# Patient Record
Sex: Female | Born: 1960 | Race: Black or African American | Hispanic: No | Marital: Married | Smoking: Never smoker
Health system: Southern US, Community
[De-identification: ages and names within clinical notes are randomized; demographics above are authoritative.]

## PROBLEM LIST (undated history)

## (undated) HISTORY — PX: ABDOMINAL HYSTERECTOMY: SHX81

---

## 1999-08-14 ENCOUNTER — Other Ambulatory Visit: Admission: RE | Admit: 1999-08-14 | Discharge: 1999-08-14 | Payer: Self-pay | Admitting: Family Medicine

## 1999-08-23 ENCOUNTER — Ambulatory Visit (HOSPITAL_COMMUNITY): Admission: RE | Admit: 1999-08-23 | Discharge: 1999-08-23 | Payer: Self-pay | Admitting: Family Medicine

## 1999-08-23 ENCOUNTER — Encounter: Payer: Self-pay | Admitting: Family Medicine

## 2000-07-10 ENCOUNTER — Emergency Department (HOSPITAL_COMMUNITY): Admission: EM | Admit: 2000-07-10 | Discharge: 2000-07-10 | Payer: Self-pay

## 2000-09-24 ENCOUNTER — Ambulatory Visit (HOSPITAL_COMMUNITY): Admission: RE | Admit: 2000-09-24 | Discharge: 2000-09-24 | Payer: Self-pay

## 2003-02-01 ENCOUNTER — Encounter: Payer: Self-pay | Admitting: Plastic Surgery

## 2003-02-01 ENCOUNTER — Ambulatory Visit (HOSPITAL_COMMUNITY): Admission: RE | Admit: 2003-02-01 | Discharge: 2003-02-01 | Payer: Self-pay | Admitting: Plastic Surgery

## 2003-03-12 ENCOUNTER — Emergency Department (HOSPITAL_COMMUNITY): Admission: EM | Admit: 2003-03-12 | Discharge: 2003-03-12 | Payer: Self-pay | Admitting: Emergency Medicine

## 2003-03-13 ENCOUNTER — Encounter: Payer: Self-pay | Admitting: *Deleted

## 2003-03-15 ENCOUNTER — Other Ambulatory Visit: Admission: RE | Admit: 2003-03-15 | Discharge: 2003-03-15 | Payer: Self-pay | Admitting: *Deleted

## 2003-04-11 ENCOUNTER — Encounter (INDEPENDENT_AMBULATORY_CARE_PROVIDER_SITE_OTHER): Payer: Self-pay

## 2003-04-11 ENCOUNTER — Inpatient Hospital Stay (HOSPITAL_COMMUNITY): Admission: RE | Admit: 2003-04-11 | Discharge: 2003-04-15 | Payer: Self-pay | Admitting: *Deleted

## 2003-04-17 ENCOUNTER — Encounter: Payer: Self-pay | Admitting: Obstetrics and Gynecology

## 2003-04-17 ENCOUNTER — Inpatient Hospital Stay (HOSPITAL_COMMUNITY): Admission: AD | Admit: 2003-04-17 | Discharge: 2003-04-22 | Payer: Self-pay | Admitting: Obstetrics and Gynecology

## 2003-04-18 ENCOUNTER — Encounter: Payer: Self-pay | Admitting: Obstetrics and Gynecology

## 2003-04-21 ENCOUNTER — Encounter: Payer: Self-pay | Admitting: Obstetrics and Gynecology

## 2004-05-15 ENCOUNTER — Other Ambulatory Visit: Admission: RE | Admit: 2004-05-15 | Discharge: 2004-05-15 | Payer: Self-pay | Admitting: Obstetrics and Gynecology

## 2005-06-13 ENCOUNTER — Other Ambulatory Visit: Admission: RE | Admit: 2005-06-13 | Discharge: 2005-06-13 | Payer: Self-pay | Admitting: Obstetrics and Gynecology

## 2007-09-08 ENCOUNTER — Encounter: Admission: RE | Admit: 2007-09-08 | Discharge: 2007-09-08 | Payer: Self-pay | Admitting: Obstetrics & Gynecology

## 2010-12-09 ENCOUNTER — Encounter: Payer: Self-pay | Admitting: Obstetrics & Gynecology

## 2011-03-14 ENCOUNTER — Other Ambulatory Visit: Payer: Self-pay | Admitting: Emergency Medicine

## 2011-03-14 ENCOUNTER — Other Ambulatory Visit: Payer: Self-pay | Admitting: Internal Medicine

## 2011-03-15 ENCOUNTER — Ambulatory Visit
Admission: RE | Admit: 2011-03-15 | Discharge: 2011-03-15 | Disposition: A | Discharge status: Home / Self Care | Payer: 59 | Source: Ambulatory Visit | Attending: Emergency Medicine | Admitting: Emergency Medicine

## 2011-04-05 NOTE — Op Note (Signed)
NAME:  Felicia Barrera, Felicia Barrera                           ACCOUNT NO.:  [id number]   MEDICAL RECORD NO.:  [id number]                   PATIENT TYPE:  INP   LOCATION:  9131                                 FACILITY:  [facility]   PHYSICIAN:  Tracie Harrier, M.D.              DATE OF BIRTH:  Jan 09, 1961   DATE OF PROCEDURE:  04/11/2003  DATE OF DISCHARGE:                                 OPERATIVE REPORT   PREOPERATIVE DIAGNOSES:  1. Massive uterine fibroids.  2. Left ovarian cyst.  3. Anemia.   POSTOPERATIVE DIAGNOSES:  1. Massive uterine fibroids.  2. Left ovarian cyst.  3. Anemia.  4. 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   PROCEDURES:  1. Total abdominal hysterectomy.  2. Left salpingo-oophorectomy.   SURGEON:  Tracie Harrier, M.D.   ASSISTANTFreddy Finner, M.D.   ANESTHESIA:  General anesthesia.   ESTIMATED BLOOD LOSS:  300 mL.   COMPLICATIONS:  None.   FINDINGS:  At the time of surgery, the uterus was identified and massive  uterine fibroids were noted. This was about 25+ weeks in size.  The left  ovary was about 6 x 6 cm in size, this was entered and a dermoid cyst was  identified.  Due to its size, the left ovary was removed.   The right ovary and tube were visualized and noted to be normal.   The appendix was also visualized briefly and noted to be normal.   DESCRIPTION OF PROCEDURE:  The patient was taken to the operating room where  general endotracheal anesthetic was administered.  The patient was placed on  the operating table in the supine position.  The abdomen, perineum, and  vagina were prepped and draped in the usual sterile fashion with Hibiclens  and sterile drapes.  A Foley catheter was inserted.  The abdomen was entered  through a Pfannenstiel incision and carried down sharply in the usual  fashion.  The peritoneum was atraumatically entered.  The large uterus was  then delivered through the incision and a self-retaining retractor was  placed and the  bowel packed away with laparotomy packs.  Hysterectomy was  then undertaken using the LigaSure device.  The round ligament was grasped,  divided by cauterizing this with the LigaSure device.  This was then sharply  divided.  The uterine ovarian ligaments were then cauterized with the  LigaSure device and sharply divided.  Two Kelly clamps were then placed over  the cornual regions of the uterus.  Successive bites were then carried down  the body of the uterus using the LigaSure device.  All of the vascular  pedicles were cauterized thoroughly with the LigaSure device and divided  sharply with the Mayo scissors.  A bladder flap was then created and the  bladder advanced well below the junction of the vagina and the cervix.   After the uterine arteries were  ligated and divided, sutures were then  placed.  Successive bites were continued to be carried down the uterus with  a straight Heaney clamp.  All vascular pedicles were sharply curetted and  suture ligated with 0 Vicryl suture.  The uterosacral ligaments were  isolated bilaterally and clamped with a curved Heaney clamp.  The uterus was  then dissected free fully.  The uterosacral ligaments were ligated  bilaterally with a transfixing suture of 0 Vicryl.  The vaginal angles were  also suture ligated with a transfixing suture of 0 Vicryl bilaterally.  The  uterus was weighed and weighed in excess of 1500 g.  The vagina and vaginal  cuff was then closed with multiple interrupted sutures of 0 Vicryl.  Attention was then turned to the left ovary.  Good hemostasis was noted from  of the operative areas.  The left ovary was visualized and noted to be  grossly about two times the size of normal.  The cystic area was then  entered and a dermoid cyst identified.  This was a very large dermoid cyst  and encompassed most of the ovary.  Therefore, left salpingo-oophorectomy  was then carried out. The ovary was quit free and elevated into the   incision.  The infundibulopelvic ligament was then clamped with a curved  Heaney clamp and the ovary dissected free.  The infundibulopelvic ligament  was then doubly ligated, first with a 0 Vicryl  tie and then a transfixing 0  Vicryl suture.  The pelvis was then thoroughly irrigated and all operative  areas were noted to be hemostatic.  The right ovary was suspended from the  right round ligament with a transfixing suture of 0 Vicryl.  Good hemostasis  thus noted.  Attention was turned to closure.  All abdominal instruments  were removed and the laparotomy packs as well.  The rectus muscle and  anterior peritoneum were closed with a running suture of #1 Vicryl.  The  subfascial areas were hemostatic.  The fascia was then closed with two  sutures of 0 PDS meeting in the midline. The subcutaneous tissue was  irrigated and made hemostatic using the Bovie cautery.  The skin was  reapproximated with staples and a sterile dressing applied.   Final sponge, needle and instrument counts were correct x3.  There were no  perioperative complications and the surgery went well.  Blood loss was 300  mL.                                               Tracie Harrier, M.D.    REG/MEDQ  D:  04/11/2003  T:  04/11/2003  Job:  [id number]

## 2011-04-05 NOTE — Consult Note (Signed)
NAME:  [name], [name]                           ACCOUNT NO.:  [id number]   MEDICAL RECORD NO.:  [id number]                   PATIENT TYPE:  INP   LOCATION:  9142                                 FACILITY:  [facility]   PHYSICIAN:  Thornton Park. Daphine Deutscher, M.D.             DATE OF BIRTH:  June 14, 1961   DATE OF CONSULTATION:  04/17/2003  DATE OF DISCHARGE:                                   CONSULTATION   CHIEF COMPLAINT:  Day and a half of nausea, vomiting.   HISTORY:  Felicia Barrera is a 50 year old black female who underwent a TAH,  left salpingo-oophorectomy on Apr 11, 2003, for a left dermoid cyst. This  was done through a Pfannenstiel incision at [facility].  The patient  was discharged on May 28th, postop day four. She went home for a day and  then began having the nausea and then vomiting and re-presented to the  Maternity Admissions site at [facility].   PAST MEDICAL HISTORY:  History of anemia.  Prior surgery includes C-section  in 1982, and then the surgery performed on Monday, May 24th.  Current  medications at the time of admission included Chromagen, and then she has  had Oxycodone for pain.   SOCIAL HISTORY:  She is married and is with her husband currently.   PHYSICAL EXAMINATION:  GENERAL:  Physical exam reveals her to be fairly  comfortable after being medicated in the bed in the ED.  HEENT EXAM:  Unremarkable. She has slightly dry mucous membranes.  NECK: Supple.  CHEST: Clear to auscultation.  HEART: There is a sinus rate of 66.  ABDOMEN: The patient's abdomen is slightly obese. There are not many bowel  sounds appreciable, but there are no hyperactive bowel sounds either. She is  more tender on the left side than the right. The patient reports that she  has had a small bowel movement while she has been here.  VITAL SIGNS: Blood pressure was 128/70, respirations 16, and she is  afebrile.   LABORATORY & X-RAYS:  X-rays were reviewed and showed dilated loops of  small  bowel in the left upper quadrant with a positive gas in the lower abdomen  and colon.  Laboratory includes electrolytes which were all in the normal  range including a potassium of 3.5. CBC shows a hemoglobin of 10.6, with a  white count of 8200.   IMPRESSION:  Partial small bowel obstruction versus postoperative ileus on  postoperative day six from prior hysterectomy.   PLAN:  Admit for observation, rehydration, and repeat x-rays. Continue NPO  and re-examine.                                               Thornton Park Daphine Deutscher, M.D.    MBM/MEDQ  D:  04/17/2003  T:  04/17/2003  Job:  [id number]

## 2011-04-05 NOTE — Discharge Summary (Signed)
NAME:  Felicia Barrera, Felicia Barrera                           ACCOUNT NO.:  [id number]   MEDICAL RECORD NO.:  [id number]                   PATIENT TYPE:  INP   LOCATION:  9125                                 [facility]ACILITY[facility]   PHYSICIAN:  Tracie Harrier, M.D.              DATE OF BIRTH:  1961-08-04   DATE OF ADMISSION:  04/17/2003  DATE OF DISCHARGE:  04/22/2003                                 DISCHARGE SUMMARY   HISTORY OF PRESENT ILLNESS:  Felicia Barrera is a 50 year old female status post  recent TAH and LSO done for benign reasons on Apr 11, 2003.  She was  routinely discharged postoperative day #4 in stable condition.  She was  readmitted on May 30 with abdominal pain and nausea and vomiting.   MEDICAL HISTORY:  History of anemia.   SURGICAL HISTORY:  1. Cesarean section x1.  2. Recent TAH and LSO.   CURRENT MEDICATIONS:  Oxycodone p.r.n.   ALLERGIES:  None known.   PHYSICAL EXAMINATION:  Please see clinic admission History and Physical.   ADMITTING DIAGNOSES:  1. Postoperative ileus versus small bowel obstruction.  2. Status post recent total abdominal hysterectomy/left salpingo-     oophorectomy.   HOSPITAL COURSE:  The patient was admitted, placed on intravenous fluids.  A  surgery consultation was undertaken.  The patient was treated with benign  measures and conservative measures for postoperative ileus.  She had slow  but steady improvement in her bowel function.  She was followed with serial  labs and x-rays which showed some improvement.  She did have hypokalemia on  Apr 18, 2003 and this was corrected.  Her hemoglobin 10.4 dropped to 8.6 and  this was asymptomatic.  She was given cathartics.  The patient did slowly  improved and had bowel movement and was tolerating a regular diet prior to  discharge.   Of note, the patient also had a urinary tract infection which grew out  greater than 100,000 colonies of E. coli.  This was treated.   The patient did improve steadily, is  discharged on April 22, 2003 in stable  condition.  Her incision did remain clean, dry, and intact.  She was  asymptomatic from an anemia standpoint.   DISCHARGE DIAGNOSES:  1. Postoperative ileus - resolved.  2. Anemia - stable.  3. Urinary tract infection.   PLAN:  1. Home.  2.     Routine postoperative instructions again given.  3. Continue oxycodone p.r.n.  4. Follow up in the office in 48 hours for a routine postoperative check.                                               Tracie Harrier, M.D.    REG/MEDQ  D:  05/31/2003  T:  05/31/2003  Job:  [id number]

## 2011-04-05 NOTE — Discharge Summary (Signed)
NAME:  Felicia Barrera, Felicia Barrera                           ACCOUNT NO.:  [id number]   MEDICAL RECORD NO.:  [id number]                   PATIENT TYPE:  INP   LOCATION:  9141                                 [facility]   PHYSICIAN:  Tracie Harrier, M.D.              DATE OF BIRTH:  Aug 03, 1961   DATE OF ADMISSION:  04/11/2003  DATE OF DISCHARGE:  04/15/2003                                 DISCHARGE SUMMARY   HISTORY OF PRESENT ILLNESS:  Ms. Douthitt is a 50 year old female gravida 1  para 1 admitted for total abdominal hysterectomy for uterine fibroids which  were quite large and symptomatic.  The patient was initially seen by myself  in the emergency room for extreme pelvic and abdominal pain.  She was also  noted to have anemia at that time.  Large uterine fibroids were encountered.  Different treatment options were discussed and she elected total abdominal  hysterectomy with ovarian conservation.   MEDICAL HISTORY:  History of anemia.   SURGICAL HISTORY:  Cesarean section x1.   OBSTETRICAL HISTORY:  Cesarean section in 1982, a female weighing 7 pounds 7.5  ounces.   CURRENT MEDICATIONS:  Chromagen.   ALLERGIES:  None known.   PHYSICAL EXAMINATION:  Please see clinic admission History and Physical.   ADMITTING DIAGNOSES:  1. Symptomatic uterine fibroids.  2. Anemia.   HOSPITAL COURSE:  The patient was admitted on the same day of surgery - Apr 11, 2003.  She underwent a total abdominal hysterectomy by a Pfannenstiel  incision.  A left salpingo-oophorectomy was done because of a left dermoid  cyst which was encompassing most of the ovary.  The surgery went well.  Blood loss was 300 mL.   The patient's postoperative course was uneventful.  She was slowly advanced  to a regular diet which she was tolerating well prior to discharge.  Her  initial hemoglobin was 9.9 and this stabilized prior to discharge at 9.5.  She was ambulating and quickly resumed normal bowel and bladder  function.   Her final pathology showed uterine weight of 1460 grams.  A benign ovarian  cyst was encountered at time of pathology.   The patient did well postoperatively and is routinely discharged  postoperative day #4 in stable condition.  She was given routine  postoperative instructions.   DISCHARGE DIAGNOSES:  1. Stable, status post total abdominal hysterectomy and left salpingo-     oophorectomy for uterine fibroids and a benign left ovarian cyst.  2. Anemia - stable.   PLAN:  1. Home.  2. Routine postoperative instructions given.  3. Percocet #30 with instructions.  4. Continue Chromagen one p.o. daily.  5. Routine incision care taught.  6. She was to follow up in the office in one week for a routine     postoperative check.  Tracie Harrier, M.D.    REG/MEDQ  D:  05/31/2003  T:  05/31/2003  Job:  [id number]

## 2011-04-05 NOTE — H&P (Signed)
NAME:  Felicia Barrera, Felicia Barrera                           ACCOUNT NO.:  [id number]   MEDICAL RECORD NO.:  [id number]                   PATIENT TYPE:  INP   LOCATION:  NA                                   [facility]   PHYSICIAN:  Tracie Harrier, M.D.              DATE OF BIRTH:  04-Dec-1960   DATE OF ADMISSION:  04/11/2003  DATE OF DISCHARGE:                                HISTORY & PHYSICAL   REASON FOR ADMISSION:  For total abdominal hysterectomy on Monday, Apr 11, 2003.   HISTORY OF PRESENT ILLNESS:  The patient is a 50 year old female who was  initially seen by myself in the emergency room for extreme pelvic and  abdominal pain.  She also had a low-grade fever and elevated white count at  that time.  At the time of her exam massive uterine fibroids were  encountered.  She also had significant anemia.  She was discharged on pain  medication and iron pills.  We have discussed different treatment options  for her large uterine fibroids and now she is admitted for total abdominal  hysterectomy.  Ovarian conservation will be observed; however, she has a  left ovarian cyst which needs to be addressed at the time of her surgery.   MEDICAL HISTORY:  History of anemia.   SURGICAL HISTORY:  Cesarean section in 1982.   OBSTETRICAL HISTORY:  Cesarean section in April 1982 - a female weighing 7  pounds 7.5 ounces at [redacted] weeks gestation.   CURRENT MEDICATIONS:  Chromagen.   ALLERGIES:  None known.   PHYSICAL EXAMINATION:  VITAL SIGNS:  Stable, blood pressure 118/68.  GENERAL:  She is a well-developed, well-nourished female in no acute  distress.  HEENT:  Within normal limits.  NECK:  Supple without adenopathy or thyromegaly.  HEART:  Regular rate and rhythm without murmur, gallop, or rub.  LUNGS:  Clear to auscultation.  BREASTS:  Exam done recently in the office is normal; breast exam is  deferred upon admission.  ABDOMEN:  Bowel sounds positive.  The abdomen is soft, nontender, with  uterine fibroids noted to above the umbilicus, especially on the left.  This  is freely mobile and nontender as mentioned.  PELVIC:  Normal external female genitalia, vagina and cervix clear.  The  uterus is extremely enlarged, extending above the umbilicus and most notably  to the left.  The adnexa is clear of mass.   LABORATORY DATA:  Hemoglobin 9.9.   ASSESSMENT:  1. Symptomatic uterine fibroids.  2. Anemia.   PLAN:  1. Total abdominal hysterectomy with ovarian conservation.  2. Evaluation of left ovarian cyst.   DISCUSSION:  The risks and benefits of this surgery discussed with the  patient.  The risk of bleeding, infection, risk of injury to surrounding  organs was reviewed and discussed.  Also, left ovarian cyst discussed with  her.  The patient really wants  this done through a Pfannenstiel incision and  we will attempt this if possible.                                               Tracie Harrier, M.D.    REG/MEDQ  D:  04/08/2003  T:  04/08/2003  Job:  [id number]

## 2017-04-19 DIAGNOSIS — J019 Acute sinusitis, unspecified: Secondary | ICD-10-CM | POA: Diagnosis not present

## 2017-04-19 DIAGNOSIS — R05 Cough: Secondary | ICD-10-CM | POA: Diagnosis not present

## 2017-05-14 DIAGNOSIS — Z01419 Encounter for gynecological examination (general) (routine) without abnormal findings: Secondary | ICD-10-CM | POA: Diagnosis not present

## 2017-05-15 ENCOUNTER — Other Ambulatory Visit: Payer: Self-pay | Admitting: Obstetrics and Gynecology

## 2017-05-15 DIAGNOSIS — R928 Other abnormal and inconclusive findings on diagnostic imaging of breast: Secondary | ICD-10-CM

## 2017-05-20 ENCOUNTER — Ambulatory Visit
Admission: RE | Admit: 2017-05-20 | Discharge: 2017-05-20 | Disposition: A | Discharge status: Home / Self Care | Payer: 59 | Source: Ambulatory Visit | Attending: Obstetrics and Gynecology | Admitting: Obstetrics and Gynecology

## 2017-05-20 ENCOUNTER — Other Ambulatory Visit: Payer: Self-pay | Admitting: Obstetrics and Gynecology

## 2017-05-20 DIAGNOSIS — R928 Other abnormal and inconclusive findings on diagnostic imaging of breast: Secondary | ICD-10-CM

## 2017-05-20 DIAGNOSIS — N6489 Other specified disorders of breast: Secondary | ICD-10-CM

## 2017-07-25 DIAGNOSIS — H10021 Other mucopurulent conjunctivitis, right eye: Secondary | ICD-10-CM | POA: Diagnosis not present

## 2017-11-04 DIAGNOSIS — J189 Pneumonia, unspecified organism: Secondary | ICD-10-CM | POA: Diagnosis not present

## 2017-11-04 DIAGNOSIS — R062 Wheezing: Secondary | ICD-10-CM | POA: Diagnosis not present

## 2017-11-20 ENCOUNTER — Ambulatory Visit: Admission: RE | Admit: 2017-11-20 | Payer: 59 | Source: Ambulatory Visit

## 2017-11-20 ENCOUNTER — Ambulatory Visit
Admission: RE | Admit: 2017-11-20 | Discharge: 2017-11-20 | Disposition: A | Discharge status: Home / Self Care | Payer: 59 | Source: Ambulatory Visit | Attending: Obstetrics and Gynecology | Admitting: Obstetrics and Gynecology

## 2017-11-20 ENCOUNTER — Other Ambulatory Visit: Payer: Self-pay | Admitting: Obstetrics and Gynecology

## 2017-11-20 DIAGNOSIS — N6489 Other specified disorders of breast: Secondary | ICD-10-CM

## 2017-11-20 DIAGNOSIS — R928 Other abnormal and inconclusive findings on diagnostic imaging of breast: Secondary | ICD-10-CM | POA: Diagnosis not present

## 2018-05-20 ENCOUNTER — Ambulatory Visit
Admission: RE | Admit: 2018-05-20 | Discharge: 2018-05-20 | Disposition: A | Discharge status: Home / Self Care | Payer: 59 | Source: Ambulatory Visit | Attending: Obstetrics and Gynecology | Admitting: Obstetrics and Gynecology

## 2018-05-20 DIAGNOSIS — R928 Other abnormal and inconclusive findings on diagnostic imaging of breast: Secondary | ICD-10-CM | POA: Diagnosis not present

## 2018-05-20 DIAGNOSIS — N6489 Other specified disorders of breast: Secondary | ICD-10-CM

## 2018-05-28 DIAGNOSIS — Z01419 Encounter for gynecological examination (general) (routine) without abnormal findings: Secondary | ICD-10-CM | POA: Diagnosis not present

## 2020-03-30 ENCOUNTER — Ambulatory Visit: Payer: 59 | Attending: Family

## 2020-03-30 DIAGNOSIS — Z23 Encounter for immunization: Secondary | ICD-10-CM

## 2020-03-30 NOTE — Progress Notes (Signed)
   Covid-19 Vaccination Clinic  Name:  Felicia Barrera    MRN: [id number] DOB: [birth year]  03/30/2020  [name] was observed post Covid-19 immunization for 15 minutes without incident. She was provided with Vaccine Information Sheet and instruction to access the V-Safe system.   Ms. North was instructed to call 911 with any severe reactions post vaccine: Marland Kitchen Difficulty breathing  . Swelling of face and throat  . A fast heartbeat  . A bad rash all over body  . Dizziness and weakness   Immunizations Administered    Name Date Dose VIS Date Route   Moderna COVID-19 Vaccine 03/30/2020  4:00 PM 0.5 mL 10/2019 Intramuscular   Manufacturer: Moderna   Lot: [id number]   NDC: 39122-583-46

## 2020-05-08 ENCOUNTER — Other Ambulatory Visit: Payer: Self-pay | Admitting: Obstetrics and Gynecology

## 2020-05-08 DIAGNOSIS — Z1231 Encounter for screening mammogram for malignant neoplasm of breast: Secondary | ICD-10-CM

## 2020-05-08 DIAGNOSIS — N6489 Other specified disorders of breast: Secondary | ICD-10-CM

## 2020-05-19 ENCOUNTER — Ambulatory Visit: Payer: 59

## 2020-05-19 ENCOUNTER — Other Ambulatory Visit: Payer: Self-pay

## 2020-05-19 ENCOUNTER — Ambulatory Visit
Admission: RE | Admit: 2020-05-19 | Discharge: 2020-05-19 | Disposition: A | Discharge status: Home / Self Care | Payer: 59 | Source: Ambulatory Visit | Attending: Obstetrics and Gynecology | Admitting: Obstetrics and Gynecology

## 2020-05-19 DIAGNOSIS — N6489 Other specified disorders of breast: Secondary | ICD-10-CM

## 2021-06-13 IMAGING — MG DIGITAL DIAGNOSTIC BILAT W/ TOMO W/ CAD
8 of 15 series · 8 of 40 positions shown · non-contrast
Comparison: Previous exam(s).

CLINICAL DATA: Short-term follow-up for a probably benign right
breast asymmetry, initially assessed with diagnostic imaging on
05/20/2017. No current breast complaints.

EXAM:
DIGITAL DIAGNOSTIC BILATERAL MAMMOGRAM WITH TOMO AND CAD

[L MLO synth-2D (1 of 2)]
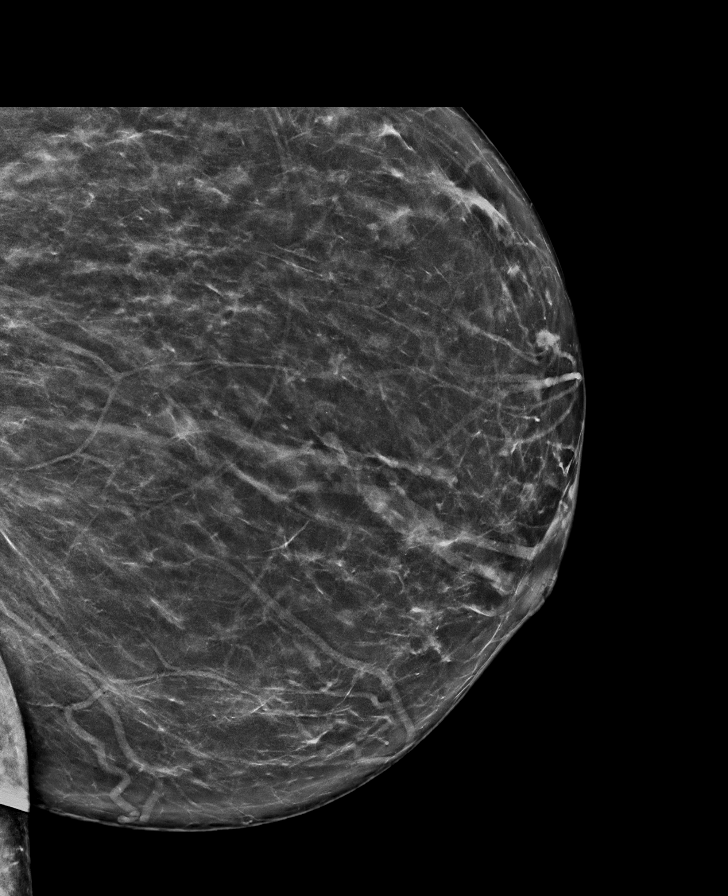

[R CC synth-2D (1 of 2)]
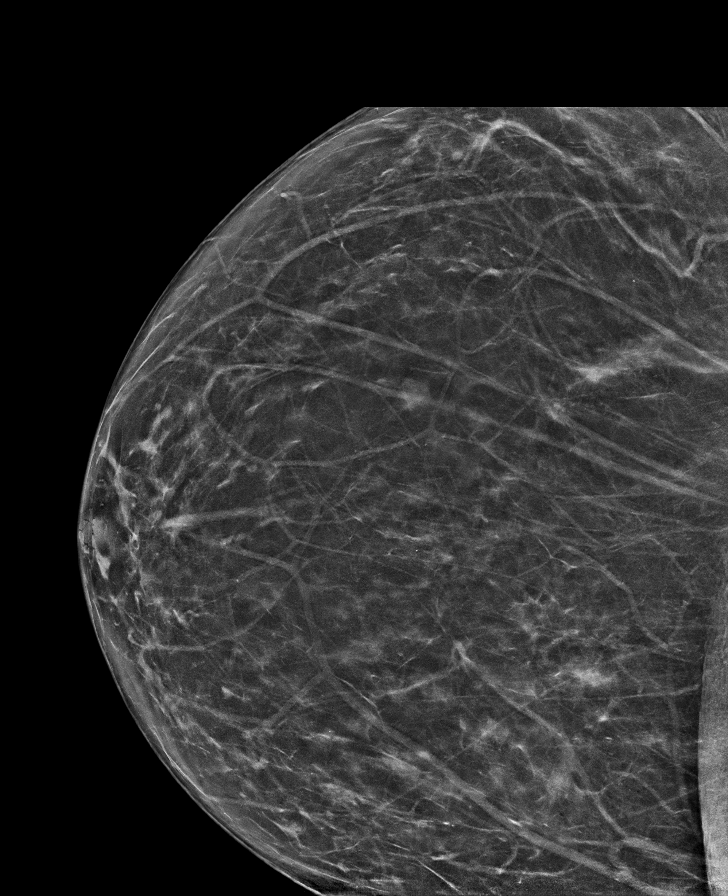

[R MLO synth-2D (1 of 2)]
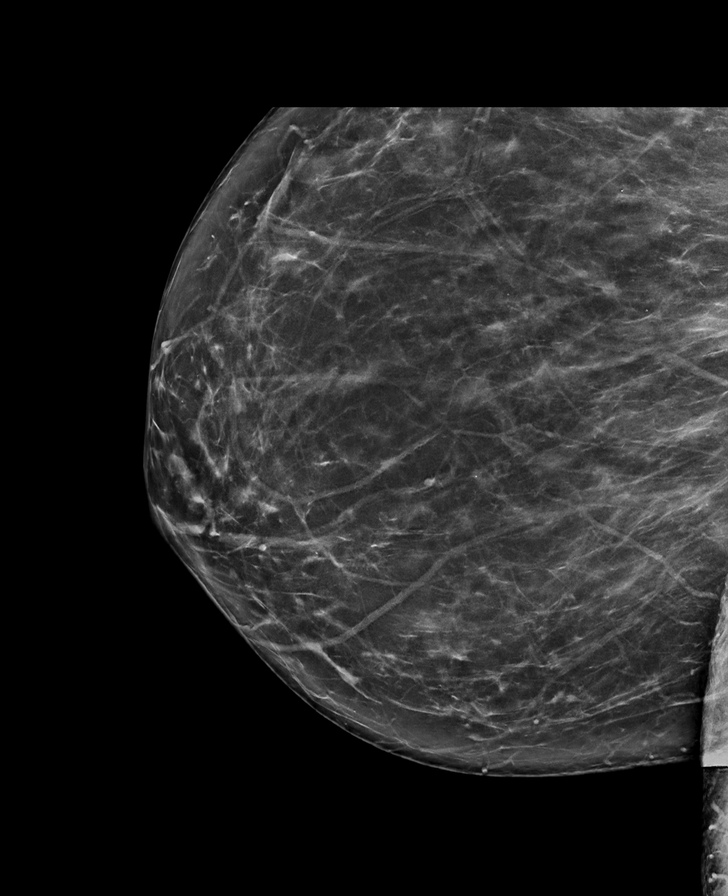

[R MLO synth-2D (2 of 2)]
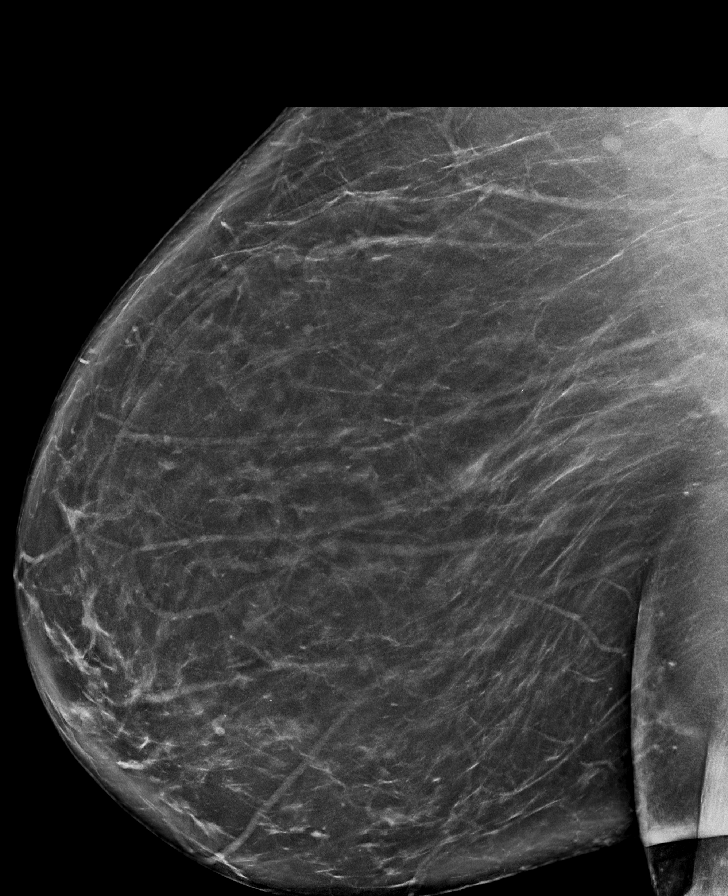

[R CC synth-2D (2 of 2)]
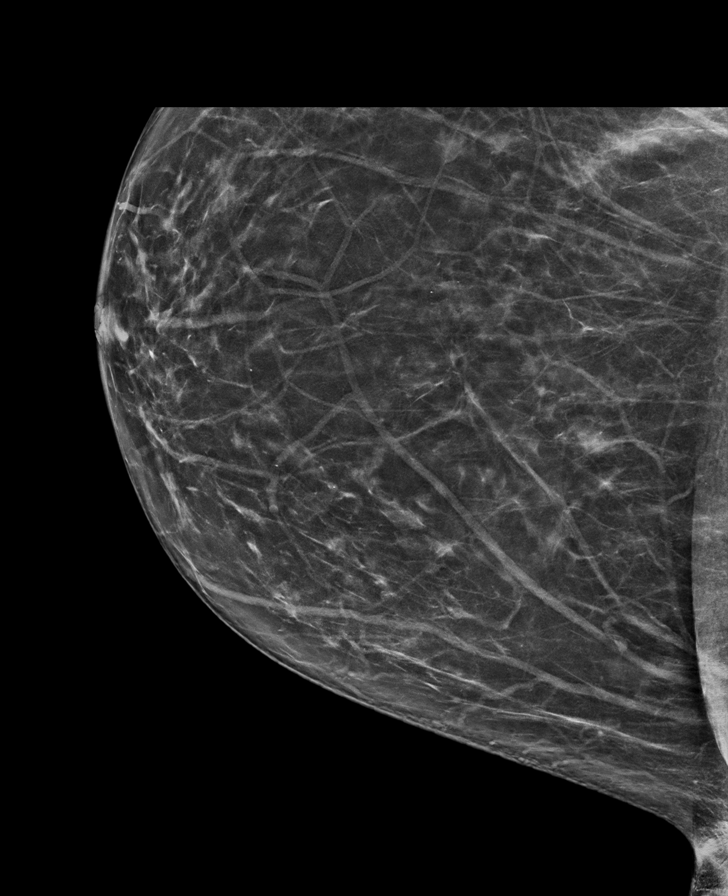

[L MLO synth-2D (2 of 2)]
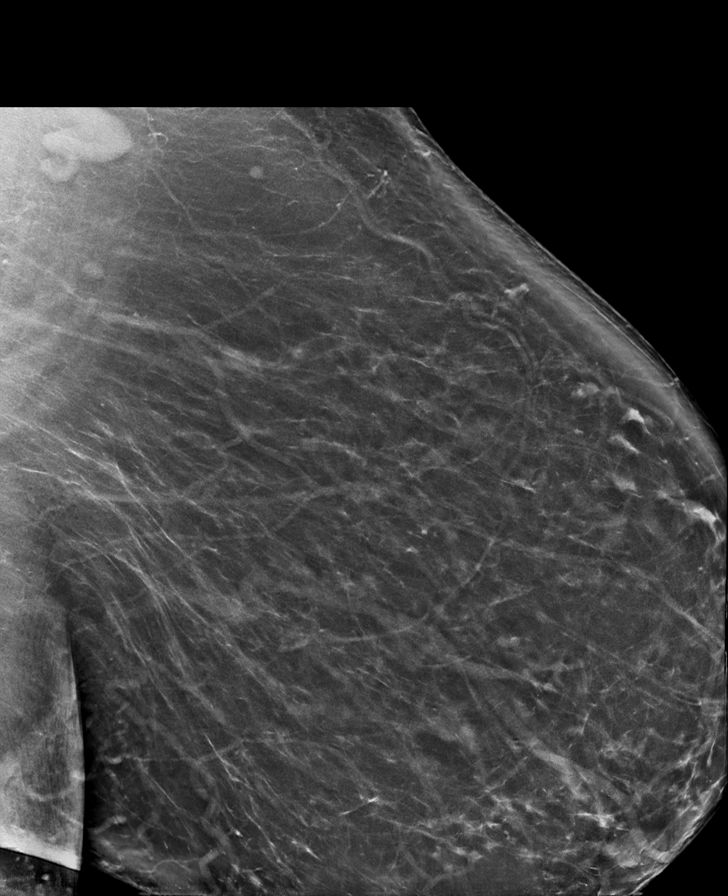

[L CC synth-2D]
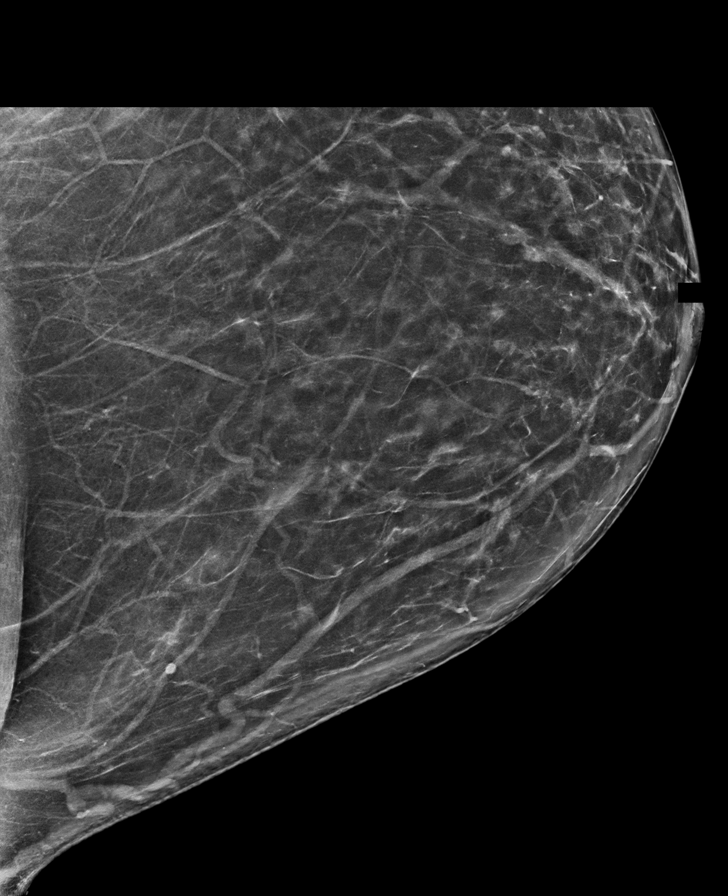

[R MLO tomo · tomo slice 74/108.0]
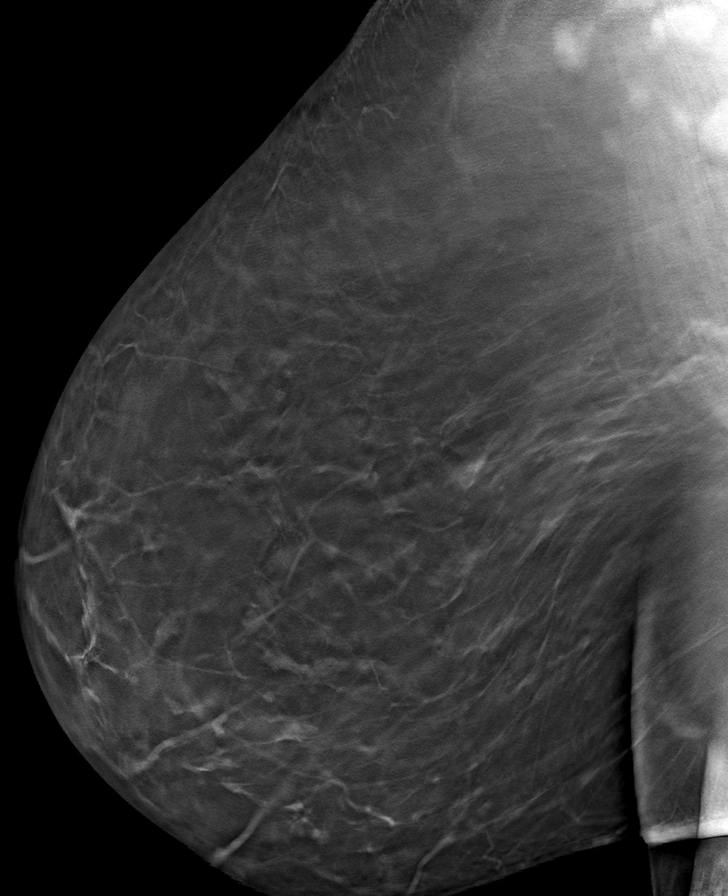

[8 of 40 positions shown; findings below may reference images not displayed]

ACR Breast Density Category b: There are scattered areas of
fibroglandular density.
FINDINGS: The asymmetry noted in the slightly lateral right breast on the
previous exams is unchanged.

There are no defined masses, new areas of asymmetry, areas of
architectural distortion or suspicious calcifications.

Mammographic images were processed with CAD.
IMPRESSION: 1. No evidence of breast malignancy.
2. Small asymmetry in the right breast has been stable for over 2
years consistent with an island of normal glandular tissue.

RECOMMENDATION:
Screening mammogram in one year.(Code:SD-V-DXH)

I have discussed the findings and recommendations with the patient.
If applicable, a reminder letter will be sent to the patient
regarding the next appointment.

BI-RADS CATEGORY  1: Negative.

## 2021-11-30 ENCOUNTER — Encounter (HOSPITAL_COMMUNITY): Payer: Self-pay

## 2021-11-30 ENCOUNTER — Ambulatory Visit (INDEPENDENT_AMBULATORY_CARE_PROVIDER_SITE_OTHER): Payer: 59

## 2021-11-30 ENCOUNTER — Other Ambulatory Visit: Payer: Self-pay

## 2021-11-30 ENCOUNTER — Ambulatory Visit (HOSPITAL_COMMUNITY)
Admission: EM | Admit: 2021-11-30 | Discharge: 2021-11-30 | Disposition: A | Discharge status: Home / Self Care | Payer: 59 | Attending: Urgent Care | Admitting: Urgent Care

## 2021-11-30 DIAGNOSIS — M545 Low back pain, unspecified: Secondary | ICD-10-CM

## 2021-11-30 DIAGNOSIS — S39012A Strain of muscle, fascia and tendon of lower back, initial encounter: Secondary | ICD-10-CM

## 2021-11-30 DIAGNOSIS — M5136 Other intervertebral disc degeneration, lumbar region: Secondary | ICD-10-CM | POA: Diagnosis not present

## 2021-11-30 MED ORDER — TIZANIDINE HCL 4 MG PO TABS: 4.0000 mg | ORAL_TABLET | Freq: Every day | ORAL | 0 refills | Status: DC

## 2021-11-30 MED ORDER — PREDNISONE 20 MG PO TABS: ORAL_TABLET | ORAL | 0 refills | Status: DC

## 2021-11-30 NOTE — ED Notes (Signed)
Called pt again, expresed not wanting a COVID test done.

## 2021-11-30 NOTE — ED Provider Notes (Signed)
[place]   MRN: [id number] DOB: 14-Dec-1960  Subjective:   Felicia Barrera is a 61 y.o. female presenting for 2-day history of acute onset persistent and worsening severe left sided low back pain.  Patient states that she really overdid it when she was doing some chores this past week.  Had to do a lot of lifting and was climbing stairs frequently.  No fall, trauma, weakness, numbness or tingling.  No dysuria, hematuria, urinary frequency.  No fever, nausea, vomiting, abdominal pain.  No history of kidney stones or kidney infections.  No history of diabetes.  No current facility-administered medications for this encounter. No current outpatient medications on file.   Allergies  Allergen Reactions   Iodinated Contrast Media    Povidone-Iodine     History reviewed. No pertinent past medical history.   Past Surgical History:  Procedure Laterality Date   ABDOMINAL HYSTERECTOMY     CESAREAN SECTION      Family History  Problem Relation Age of Onset   Breast cancer Sister 32    Social History   Tobacco Use   Smoking status: Never   Smokeless tobacco: Never    ROS   Objective:   Vitals: BP (!) 149/89 (BP Location: Right Arm)    Pulse 94    Temp 97.8 F (36.6 C) (Oral)    Resp 20    SpO2 98%   Physical Exam Constitutional:      General: She is not in acute distress.    Appearance: Normal appearance. She is well-developed. She is obese. She is not ill-appearing, toxic-appearing or diaphoretic.  HENT:     Head: Normocephalic and atraumatic.     Nose: Nose normal.     Mouth/Throat:     Mouth: Mucous membranes are moist.  Eyes:     General: No scleral icterus.       Right eye: No discharge.        Left eye: No discharge.     Extraocular Movements: Extraocular movements intact.     Conjunctiva/sclera: Conjunctivae normal.  Cardiovascular:     Rate and Rhythm: Normal rate.  Pulmonary:     Effort: Pulmonary effort is normal.  Musculoskeletal:      Lumbar back: Spasms and tenderness (over area outlined) present. No swelling, edema, deformity, signs of trauma, lacerations or bony tenderness. Normal range of motion. Negative right straight leg raise test and negative left straight leg raise test. No scoliosis.       Back:  Skin:    General: Skin is warm and dry.  Neurological:     General: No focal deficit present.     Mental Status: She is alert and oriented to person, place, and time.     Motor: No weakness.     Coordination: Coordination normal.     Gait: Gait normal.     Deep Tendon Reflexes: Reflexes normal.  Psychiatric:        Mood and Affect: Mood normal.        Behavior: Behavior normal.        Thought Content: Thought content normal.        Judgment: Judgment normal.    DG Lumbar Spine Complete  Result Date: 11/30/2021 CLINICAL DATA:  Severe low back pain for 2 days, onset after vacuuming EXAM: LUMBAR SPINE - COMPLETE 4+ VIEW COMPARISON:  None FINDINGS: 5 non-rib-bearing lumbar vertebra. Hypoplastic last ribs. Disc space narrowing L5-S1. Tiny endplate spurs [id number] and L4-L5. Vertebral body  heights maintained without fracture or subluxation. Facet degenerative changes lower lumbar spine. SI joints preserved. No spondylolysis. IMPRESSION: Mild degenerative disc and facet disease changes lumbar spine. Electronically Signed   By: Lavonia Dana M.D.   On: 11/30/2021 10:00     Assessment and Plan :   PDMP not reviewed this encounter.  1. Acute left-sided low back pain without sciatica   2. Strain of lumbar region, initial encounter   3. Severe low back pain   4. Degenerative disc disease, lumbar    Patient's back pain has not responded to over-the-counter medications including Tylenol, Motrin.  Given the severity of her back pain and x-ray findings of degenerative disc disease, recommended an oral prednisone course and tizanidine.  Otherwise, provided anticipatory guidance for lumbar strain.  Discussed back care and emphasized  need to hydrate well.  Counseled patient on potential for adverse effects with medications prescribed/recommended today, ER and return-to-clinic precautions discussed, patient verbalized understanding.    Jaynee Eagles, PA-C 11/30/21 1005

## 2021-11-30 NOTE — ED Triage Notes (Signed)
Pt presents with severe lower back pain X 2 days 

## 2021-11-30 NOTE — ED Notes (Signed)
Called pt to have her come back and have a COVID swab done.  Pt left with out it.

## 2022-01-30 ENCOUNTER — Other Ambulatory Visit: Payer: Self-pay | Admitting: Obstetrics and Gynecology

## 2022-01-30 DIAGNOSIS — Z1231 Encounter for screening mammogram for malignant neoplasm of breast: Secondary | ICD-10-CM

## 2022-02-04 ENCOUNTER — Ambulatory Visit
Admission: RE | Admit: 2022-02-04 | Discharge: 2022-02-04 | Disposition: A | Discharge status: Home / Self Care | Payer: 59 | Source: Ambulatory Visit | Attending: Obstetrics and Gynecology | Admitting: Obstetrics and Gynecology

## 2022-02-04 DIAGNOSIS — Z1231 Encounter for screening mammogram for malignant neoplasm of breast: Secondary | ICD-10-CM

## 2022-02-06 ENCOUNTER — Other Ambulatory Visit: Payer: Self-pay | Admitting: Obstetrics and Gynecology

## 2022-02-06 DIAGNOSIS — R928 Other abnormal and inconclusive findings on diagnostic imaging of breast: Secondary | ICD-10-CM

## 2022-02-21 ENCOUNTER — Ambulatory Visit
Admission: RE | Admit: 2022-02-21 | Discharge: 2022-02-21 | Disposition: A | Discharge status: Home / Self Care | Payer: 59 | Source: Ambulatory Visit | Attending: Obstetrics and Gynecology | Admitting: Obstetrics and Gynecology

## 2022-02-21 ENCOUNTER — Other Ambulatory Visit: Payer: Self-pay | Admitting: Obstetrics and Gynecology

## 2022-02-21 DIAGNOSIS — R599 Enlarged lymph nodes, unspecified: Secondary | ICD-10-CM

## 2022-02-21 DIAGNOSIS — R928 Other abnormal and inconclusive findings on diagnostic imaging of breast: Secondary | ICD-10-CM

## 2022-05-27 ENCOUNTER — Other Ambulatory Visit: Payer: Self-pay | Admitting: Obstetrics and Gynecology

## 2022-05-27 ENCOUNTER — Ambulatory Visit
Admission: RE | Admit: 2022-05-27 | Discharge: 2022-05-27 | Disposition: A | Discharge status: Home / Self Care | Payer: 59 | Source: Ambulatory Visit | Attending: Obstetrics and Gynecology | Admitting: Obstetrics and Gynecology

## 2022-05-27 DIAGNOSIS — R599 Enlarged lymph nodes, unspecified: Secondary | ICD-10-CM

## 2022-05-28 ENCOUNTER — Ambulatory Visit
Admission: RE | Admit: 2022-05-28 | Discharge: 2022-05-28 | Disposition: A | Discharge status: Home / Self Care | Payer: 59 | Source: Ambulatory Visit | Attending: Obstetrics and Gynecology | Admitting: Obstetrics and Gynecology

## 2022-05-28 ENCOUNTER — Other Ambulatory Visit (HOSPITAL_COMMUNITY)
Admission: RE | Admit: 2022-05-28 | Discharge: 2022-05-28 | Disposition: A | Discharge status: Home / Self Care | Payer: 59 | Source: Ambulatory Visit | Attending: Obstetrics and Gynecology | Admitting: Obstetrics and Gynecology

## 2022-05-28 DIAGNOSIS — R599 Enlarged lymph nodes, unspecified: Secondary | ICD-10-CM

## 2022-05-29 LAB — SURGICAL PATHOLOGY

## 2022-11-27 ENCOUNTER — Other Ambulatory Visit: Payer: Self-pay | Admitting: Obstetrics and Gynecology

## 2022-11-27 DIAGNOSIS — R928 Other abnormal and inconclusive findings on diagnostic imaging of breast: Secondary | ICD-10-CM

## 2022-12-03 ENCOUNTER — Other Ambulatory Visit: Payer: Self-pay | Admitting: Obstetrics and Gynecology

## 2022-12-03 ENCOUNTER — Ambulatory Visit
Admission: RE | Admit: 2022-12-03 | Discharge: 2022-12-03 | Disposition: A | Discharge status: Home / Self Care | Payer: 59 | Source: Ambulatory Visit | Attending: Obstetrics and Gynecology | Admitting: Obstetrics and Gynecology

## 2022-12-03 DIAGNOSIS — R928 Other abnormal and inconclusive findings on diagnostic imaging of breast: Secondary | ICD-10-CM

## 2022-12-03 DIAGNOSIS — N6310 Unspecified lump in the right breast, unspecified quadrant: Secondary | ICD-10-CM

## 2023-03-05 ENCOUNTER — Ambulatory Visit
Admission: RE | Admit: 2023-03-05 | Discharge: 2023-03-05 | Disposition: A | Discharge status: Home / Self Care | Payer: 59 | Source: Ambulatory Visit | Attending: Obstetrics and Gynecology | Admitting: Obstetrics and Gynecology

## 2023-03-05 DIAGNOSIS — N6310 Unspecified lump in the right breast, unspecified quadrant: Secondary | ICD-10-CM

## 2023-03-18 IMAGING — MG MM DIGITAL DIAGNOSTIC UNILAT*R* W/ TOMO W/ CAD
4 series · 4 of 12 positions shown · non-contrast
Comparison: Previous exam(s).

CLINICAL DATA: Recall from screening mammography, possible mass
involving the RIGHT breast.

EXAM:
DIGITAL DIAGNOSTIC UNILATERAL RIGHT MAMMOGRAM WITH TOMOSYNTHESIS AND
CAD; ULTRASOUND RIGHT BREAST LIMITED
TECHNIQUE: Right digital diagnostic mammography and breast tomosynthesis was
performed. The images were evaluated with computer-aided detection.;
Targeted ultrasound examination of the right breast was performed.

[R CC synth-2D]
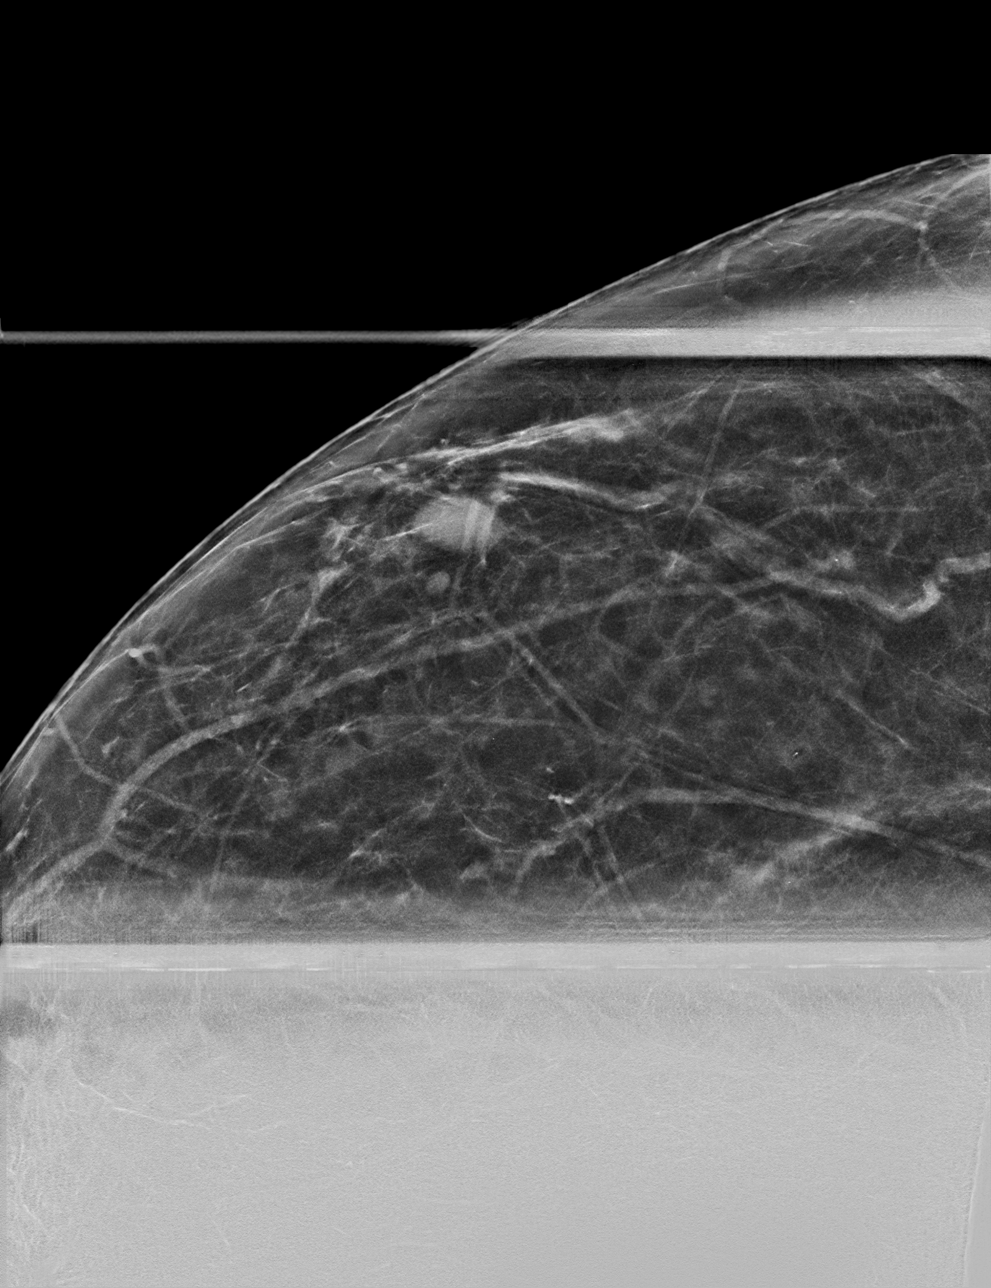

[R MLO synth-2D]
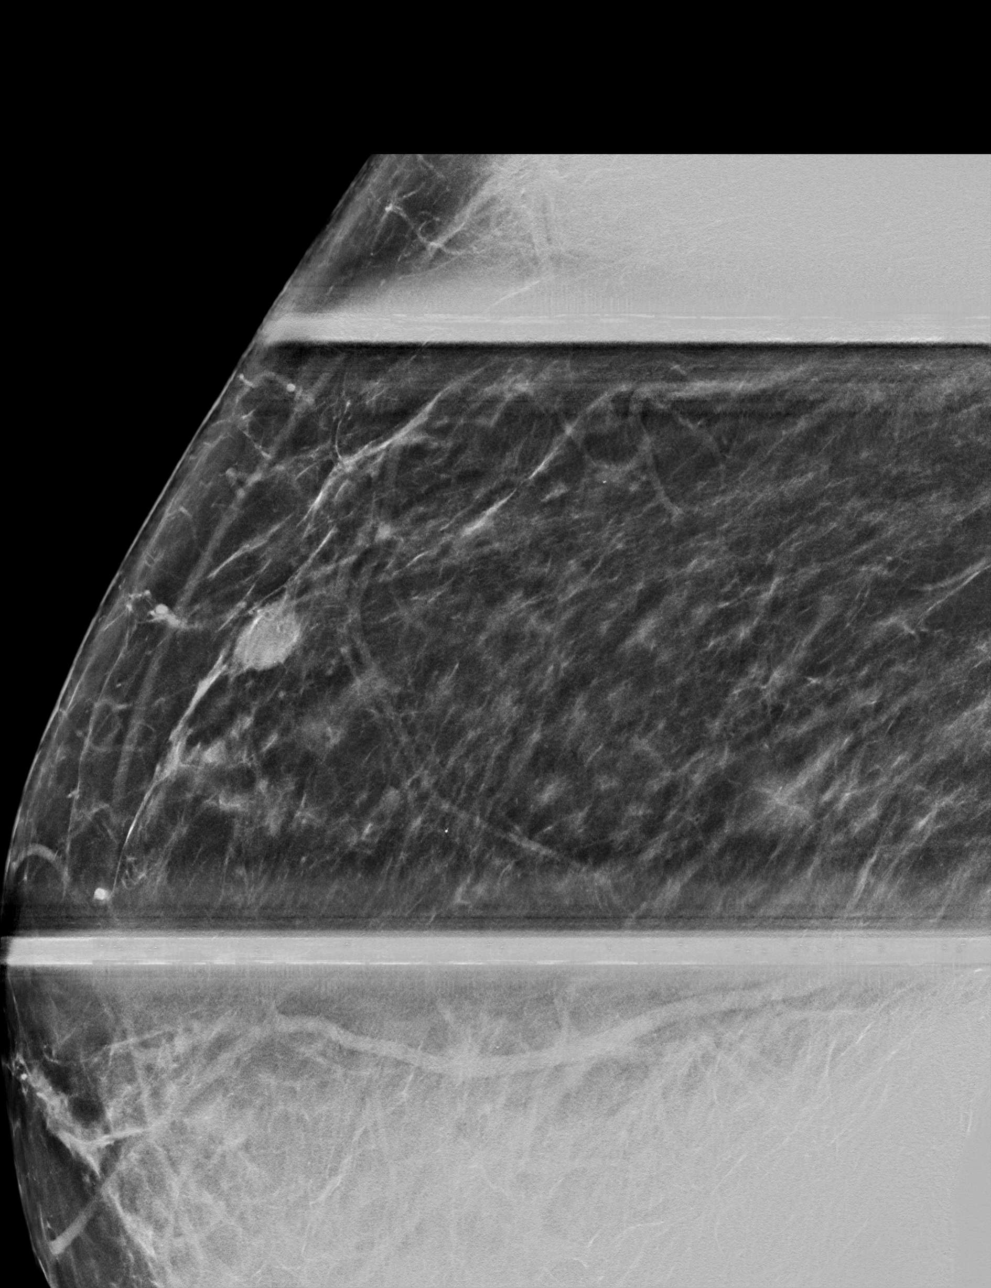

[R MLO tomo · tomo slice 41/80.0]
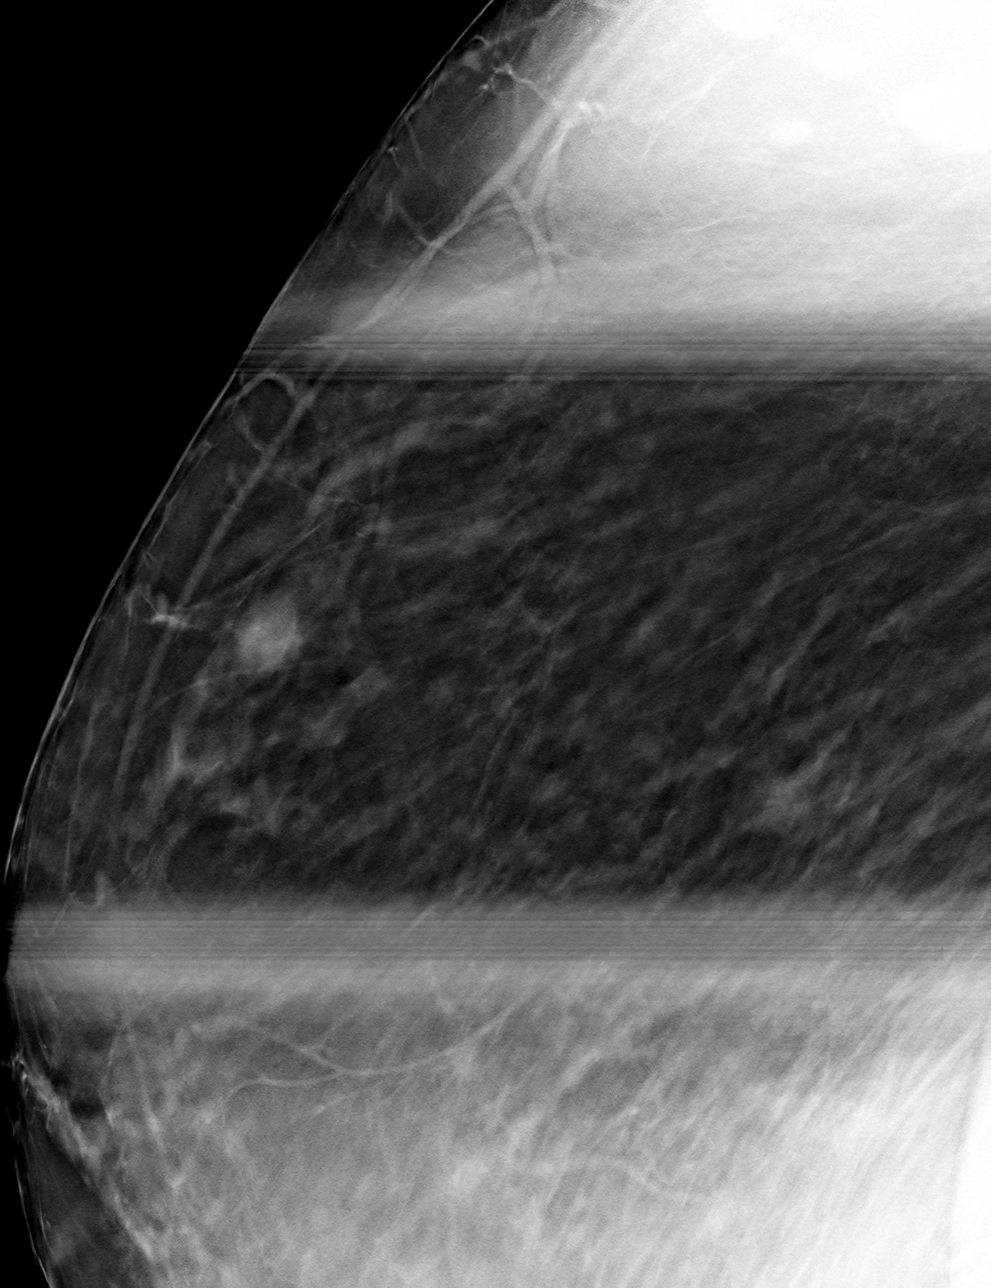

[R CC tomo · tomo slice 36/71.0]
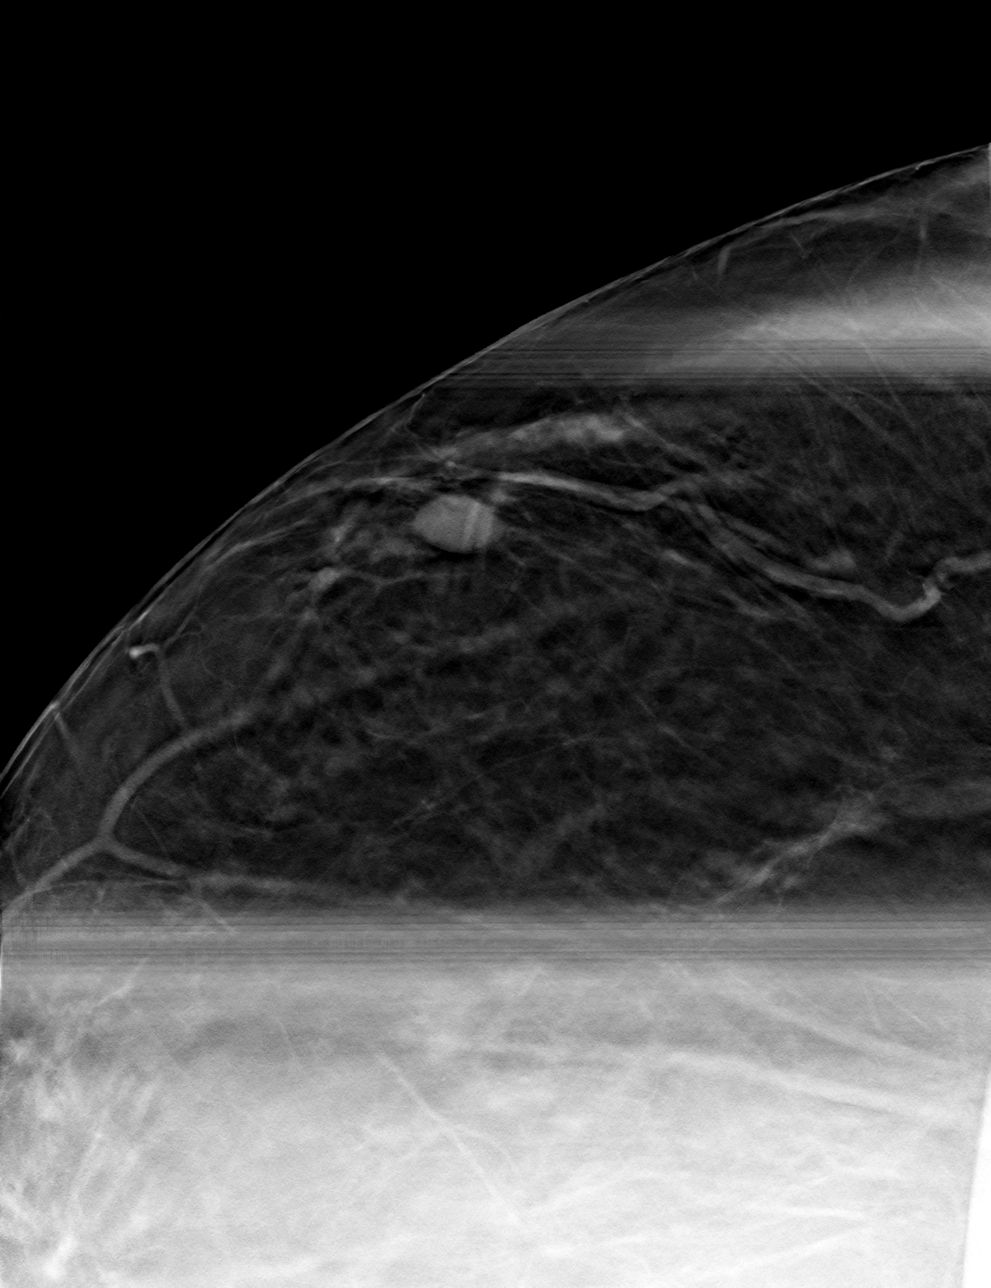

[4 of 12 positions shown; findings below may reference images not displayed]

ACR Breast Density Category b: There are scattered areas of
fibroglandular density.
FINDINGS: Spot-compression CC and MLO views of the area of concern were
obtained.

Circumscribed isodense mass superficially involving the UPPER OUTER
QUADRANT at middle depth measuring on the order of 1.5 cm. There is
no associated architectural distortion or suspicious calcifications.

Targeted ultrasound is performed, demonstrating an intramammary
lymph node with eccentric cortical thickening up to 0.5 cm at the 10
o'clock position 10 cm from the nipple. The lymph node measures
cm in length. A vessel is seen entering the fatty hilus on power
Doppler evaluation.
IMPRESSION: Likely benign intramammary lymph node involving the UPPER OUTER
QUADRANT of the RIGHT breast at 10 o'clock 10 cm from the nipple
which accounts for the screening mammographic finding.

RECOMMENDATION:
RIGHT breast ultrasound in 3 months to confirm stability of the
likely benign intramammary lymph node.

I have discussed the findings and recommendations with the patient.
If applicable, a reminder letter will be sent to the patient
regarding the next appointment.

BI-RADS CATEGORY  3: Probably benign.

## 2023-06-18 ENCOUNTER — Other Ambulatory Visit: Payer: Self-pay

## 2023-06-18 ENCOUNTER — Ambulatory Visit (HOSPITAL_COMMUNITY)
Admission: EM | Admit: 2023-06-18 | Discharge: 2023-06-18 | Disposition: A | Discharge status: Home / Self Care | Payer: 59 | Attending: Family Medicine | Admitting: Family Medicine

## 2023-06-18 ENCOUNTER — Encounter (HOSPITAL_COMMUNITY): Payer: Self-pay | Admitting: Emergency Medicine

## 2023-06-18 DIAGNOSIS — M25561 Pain in right knee: Secondary | ICD-10-CM | POA: Diagnosis not present

## 2023-06-18 MED ORDER — PREDNISONE 10 MG (21) PO TBPK: ORAL_TABLET | Freq: Every day | ORAL | 0 refills | Status: AC

## 2023-06-18 NOTE — ED Triage Notes (Signed)
Reports right knee pain.  Reports significant pain in right knee on Friday.  Patient has been using a walker and it has been helpful.  Patient does not use a walker typically.  Denies any fall  Patient is concerned for arthritis.  Patient also has a psoriasis flare up currently

## 2023-06-18 NOTE — Discharge Instructions (Addendum)
Continue Aleve twice daily.

## 2023-06-19 NOTE — ED Provider Notes (Signed)
[facility]   [id number] 06/18/23 Arrival Time: 1733  ASSESSMENT & PLAN:  1. Acute pain of right knee    Suspect bursitis. Continue Aleve BID.  Discharge Medication List as of 06/18/2023  7:06 PM     START taking these medications   Details  predniSONE (STERAPRED UNI-PAK 21 TAB) 10 MG (21) TBPK tablet Take by mouth daily. Take as directed., Starting Wed 06/18/2023, Normal       Work/school excuse note: not needed. Recommend:  Follow-up Information     Schedule an appointment as soon as possible for a visit  with [facility].   Contact information: [address] [place] [address] [phone]               Activities as tolerated.  Reviewed expectations re: course of current medical issues. Questions answered. Outlined signs and symptoms indicating need for more acute intervention. Patient verbalized understanding. After Visit Summary given.  SUBJECTIVE: History from: patient. Felicia Barrera is a 62 y.o. female who reports persistent marked pain of her right knee; described as aching; without radiation. Onset: gradual. First noted:  several d ago . Injury/trama: denies. Symptoms have progressed to a point and plateaued since beginning. Aggravating factors: weight bearing and prolonged walking/standing. Alleviating factors: have not been identified. Associated symptoms: none reported. Extremity sensation changes or weakness: none. Self treatment:  Aleve with mild help .  History of similar: no.  Past Surgical History:  Procedure Laterality Date   ABDOMINAL HYSTERECTOMY     CESAREAN SECTION        OBJECTIVE:  Vitals:   06/18/23 1853  BP: (!) 154/82  Pulse: 92  Resp: 20  Temp: 98 F (36.7 C)  TempSrc: Oral  SpO2: 99%    General appearance: alert; no distress HEENT: [place]; AT Neck: supple with FROM Resp: unlabored respirations Extremities: RLE: obesity limits exam; warm with well  perfused appearance; is TTP over pes anserine bursa; no overlying erythema; knee with FROM CV: brisk extremity capillary refill of RLE; 2+ DP pulse of RLE. Skin: warm and dry; no visible rashes Neurologic: gait  normal with walker ; normal sensation and strength of RLE Psychological: alert and cooperative; normal mood and affect    Allergies  Allergen Reactions   Iodinated Contrast Media    Povidone-Iodine     History reviewed. No pertinent past medical history. Social History   Socioeconomic History   Marital status: Married    Spouse name: Not on file   Number of children: Not on file   Years of education: Not on file   Highest education level: Not on file  Occupational History   Not on file  Tobacco Use   Smoking status: Never   Smokeless tobacco: Never  Vaping Use   Vaping status: Never Used  Substance and Sexual Activity   Alcohol use: Never   Drug use: Never   Sexual activity: Not on file  Other Topics Concern   Not on file  Social History Narrative   Not on file   Social Determinants of Health   Financial Resource Strain: Not on file  Food Insecurity: Not on file  Transportation Needs: Not on file  Physical Activity: Not on file  Stress: Not on file  Social Connections: Not on file   Family History  Problem Relation Age of Onset   Breast cancer Sister 37   Past Surgical History:  Procedure Laterality Date   ABDOMINAL HYSTERECTOMY  CESAREAN SECTION         Mardella Layman, MD 06/19/23 [phone]

## 2024-03-09 ENCOUNTER — Other Ambulatory Visit: Payer: Self-pay | Admitting: Obstetrics and Gynecology

## 2024-03-09 DIAGNOSIS — N6310 Unspecified lump in the right breast, unspecified quadrant: Secondary | ICD-10-CM

## 2024-03-23 ENCOUNTER — Ambulatory Visit
Admission: RE | Admit: 2024-03-23 | Discharge: 2024-03-23 | Disposition: A | Discharge status: Home / Self Care | Source: Ambulatory Visit | Attending: Obstetrics and Gynecology | Admitting: Obstetrics and Gynecology

## 2024-03-23 DIAGNOSIS — N6310 Unspecified lump in the right breast, unspecified quadrant: Secondary | ICD-10-CM
